# Patient Record
Sex: Female | Born: 1950 | Race: White | Hispanic: No | Marital: Married | Smoking: Never smoker
Health system: Southern US, Community
[De-identification: ages and names within clinical notes are randomized; demographics above are authoritative.]

## PROBLEM LIST (undated history)

## (undated) DIAGNOSIS — K219 Gastro-esophageal reflux disease without esophagitis: Secondary | ICD-10-CM

## (undated) DIAGNOSIS — F40240 Claustrophobia: Secondary | ICD-10-CM

## (undated) DIAGNOSIS — C50919 Malignant neoplasm of unspecified site of unspecified female breast: Secondary | ICD-10-CM

## (undated) DIAGNOSIS — F419 Anxiety disorder, unspecified: Secondary | ICD-10-CM

## (undated) DIAGNOSIS — I10 Essential (primary) hypertension: Secondary | ICD-10-CM

## (undated) HISTORY — DX: Essential (primary) hypertension: I10

## (undated) HISTORY — DX: Anxiety disorder, unspecified: F41.9

## (undated) HISTORY — DX: Malignant neoplasm of unspecified site of unspecified female breast: C50.919

## (undated) HISTORY — DX: Claustrophobia: F40.240

## (undated) HISTORY — DX: Gastro-esophageal reflux disease without esophagitis: K21.9

---

## 2013-11-28 DIAGNOSIS — Z0000 Encounter for general adult medical examination without abnormal findings: Secondary | ICD-10-CM | POA: Insufficient documentation

## 2013-11-30 DIAGNOSIS — J302 Other seasonal allergic rhinitis: Secondary | ICD-10-CM | POA: Insufficient documentation

## 2013-11-30 DIAGNOSIS — K219 Gastro-esophageal reflux disease without esophagitis: Secondary | ICD-10-CM | POA: Insufficient documentation

## 2014-04-02 DIAGNOSIS — R5383 Other fatigue: Secondary | ICD-10-CM | POA: Insufficient documentation

## 2016-10-16 DIAGNOSIS — I10 Essential (primary) hypertension: Secondary | ICD-10-CM | POA: Insufficient documentation

## 2017-01-12 DIAGNOSIS — C50411 Malignant neoplasm of upper-outer quadrant of right female breast: Secondary | ICD-10-CM | POA: Insufficient documentation

## 2017-01-12 DIAGNOSIS — Z170 Estrogen receptor positive status [ER+]: Secondary | ICD-10-CM

## 2017-01-19 DIAGNOSIS — C50919 Malignant neoplasm of unspecified site of unspecified female breast: Secondary | ICD-10-CM

## 2017-01-19 HISTORY — DX: Malignant neoplasm of unspecified site of unspecified female breast: C50.919

## 2017-01-25 DIAGNOSIS — C773 Secondary and unspecified malignant neoplasm of axilla and upper limb lymph nodes: Secondary | ICD-10-CM

## 2017-01-25 DIAGNOSIS — C50911 Malignant neoplasm of unspecified site of right female breast: Secondary | ICD-10-CM | POA: Insufficient documentation

## 2017-08-09 ENCOUNTER — Encounter: Payer: Self-pay | Admitting: Hematology

## 2017-10-08 ENCOUNTER — Encounter: Payer: Self-pay | Admitting: Hematology

## 2018-02-06 ENCOUNTER — Other Ambulatory Visit: Payer: Self-pay

## 2018-02-06 ENCOUNTER — Inpatient Hospital Stay (HOSPITAL_COMMUNITY): Payer: Medicare Other | Attending: Hematology | Admitting: Hematology

## 2018-02-06 ENCOUNTER — Encounter (HOSPITAL_COMMUNITY): Payer: Self-pay | Admitting: Hematology

## 2018-02-06 ENCOUNTER — Inpatient Hospital Stay (HOSPITAL_COMMUNITY): Payer: Medicare Other

## 2018-02-06 DIAGNOSIS — Z9221 Personal history of antineoplastic chemotherapy: Secondary | ICD-10-CM | POA: Diagnosis not present

## 2018-02-06 DIAGNOSIS — R921 Mammographic calcification found on diagnostic imaging of breast: Secondary | ICD-10-CM

## 2018-02-06 DIAGNOSIS — Z853 Personal history of malignant neoplasm of breast: Secondary | ICD-10-CM | POA: Diagnosis present

## 2018-02-06 DIAGNOSIS — Z923 Personal history of irradiation: Secondary | ICD-10-CM | POA: Diagnosis not present

## 2018-02-06 LAB — CBC WITH DIFFERENTIAL/PLATELET
BASOS ABS: 0 10*3/uL (ref 0.0–0.1)
BASOS PCT: 0 %
Eosinophils Absolute: 0.1 10*3/uL (ref 0.0–0.7)
Eosinophils Relative: 2 %
HCT: 38 % (ref 36.0–46.0)
HEMOGLOBIN: 13 g/dL (ref 12.0–15.0)
LYMPHS PCT: 33 %
Lymphs Abs: 2.3 10*3/uL (ref 0.7–4.0)
MCH: 32.2 pg (ref 26.0–34.0)
MCHC: 34.2 g/dL (ref 30.0–36.0)
MCV: 94.1 fL (ref 78.0–100.0)
Monocytes Absolute: 0.8 10*3/uL (ref 0.1–1.0)
Monocytes Relative: 11 %
NEUTROS PCT: 54 %
Neutro Abs: 3.9 10*3/uL (ref 1.7–7.7)
Platelets: 253 10*3/uL (ref 150–400)
RBC: 4.04 MIL/uL (ref 3.87–5.11)
RDW: 12.5 % (ref 11.5–15.5)
WBC: 7.1 10*3/uL (ref 4.0–10.5)

## 2018-02-06 LAB — COMPREHENSIVE METABOLIC PANEL
ALK PHOS: 125 U/L (ref 38–126)
ALT: 19 U/L (ref 14–54)
ANION GAP: 9 (ref 5–15)
AST: 24 U/L (ref 15–41)
Albumin: 4 g/dL (ref 3.5–5.0)
BILIRUBIN TOTAL: 0.8 mg/dL (ref 0.3–1.2)
BUN: 16 mg/dL (ref 6–20)
CALCIUM: 8.7 mg/dL — AB (ref 8.9–10.3)
CO2: 26 mmol/L (ref 22–32)
Chloride: 102 mmol/L (ref 101–111)
Creatinine, Ser: 0.6 mg/dL (ref 0.44–1.00)
GFR calc non Af Amer: >60 mL/min (ref >60)
Glucose, Bld: 74 mg/dL (ref 65–99)
POTASSIUM: 4.2 mmol/L (ref 3.5–5.1)
SODIUM: 137 mmol/L (ref 135–145)
TOTAL PROTEIN: 7.3 g/dL (ref 6.5–8.1)

## 2018-02-06 MED ORDER — SODIUM CHLORIDE 0.9% FLUSH
10.0000 mL | Freq: Once | INTRAVENOUS | Status: AC
  Administered 2018-02-06: 10 mL

## 2018-02-06 MED ORDER — HEPARIN SOD (PORK) LOCK FLUSH 100 UNIT/ML IV SOLN
INTRAVENOUS | Status: AC
  Filled 2018-02-06: qty 5

## 2018-02-06 MED ORDER — HEPARIN SOD (PORK) LOCK FLUSH 100 UNIT/ML IV SOLN
500.0000 [IU] | Freq: Once | INTRAVENOUS | Status: AC
  Administered 2018-02-06: 500 [IU] via INTRAVENOUS

## 2018-02-06 NOTE — Assessment & Plan Note (Addendum)
1.  Stage IIb (T2N1) right breast TNBC: - Status post lumpectomy and sentinel lymph node biopsy on 01/19/2017 by Dr. Johny Drilling at [facility], 3.6 cm tumor, one lymph node with metastatic carcinoma, 0.8 cm with extracapsular extension, one lymph node with rare isolated tumor cells, high-grade, positive lymphovascular invasion - Adjuvant chemotherapy with dose dense AC 4 cycles from 02/28/2017 through 04/18/2017, weekly paclitaxel from 04/24/2017 through 08/09/2017 - BRCA1/2 and other high risk genes negative -Adjuvant radiation therapy to the right breast from 10/01/2017 through 10/26/2017 - Recent mammogram on 02/04/2018 at [facility] shows grouped calcifications in the upper outer right breast, BI-RADS Category 3, with recommendation for six-month follow-up. -We will do blood work today.  Physical exam was within normal limits.  We will also check a vitamin D level.  She will follow-up with me once every 4 months for the first 3 years.  She wants to wait until a year before port will be discontinued.  We will plan to repeat a bone density test if it is not done in the last couple of years.

## 2018-02-06 NOTE — Progress Notes (Signed)
[facility] NOTE  Patient Care Team: Lonia Mad, MD as PCP - General (Internal Medicine)  CHIEF COMPLAINTS/PURPOSE OF CONSULTATION:  Follow-up for a triple negative right breast cancer.  HISTORY OF PRESENTING ILLNESS:  Patricia Estes 66 y.o. female is very pleasant white female who is seen in consultation today to establish follow-up visits for history of triple negative right breast cancer.  She had a lumpectomy done in June 2018 followed by adjuvant chemotherapy.  She finished adjuvant radiation therapy on 10/26/2017.  She denies any new onset pains.  She is exercising on regular basis.  She was reportedly started on high-dose vitamin D in April, which caused her to have numbness in the face.  She discontinued it in May.  She is not taking any calcium supplements.  She reports that she had a bone density test in the past.  She has occasional constipation which is stable.  Occasional dizziness from vertigo is also stable.  Denies any recent hospitalizations.  I reviewed her records extensively and collaborated the history with the patient.  Family history is positive for sister having melanoma, and cervical cancer, mother having melanoma and uterine cancer in her 14s.  MEDICAL HISTORY:  Past Medical History:  Diagnosis Date  . Anxiety   . Breast cancer ([place]) 01/19/2017   tRIPLE nEGATIVE   . Claustrophobia   . GERD (gastroesophageal reflux disease)   . Hypertension     SURGICAL HISTORY: History reviewed. No pertinent surgical history.  SOCIAL HISTORY: Social History   Socioeconomic History  . Marital status: Married    Spouse name: Not on file  . Number of children: Not on file  . Years of education: Not on file  . Highest education level: Not on file  Occupational History  . Not on file  Social Needs  . Financial resource strain: Not on file  . Food insecurity:    Worry: Not on file    Inability: Not on file  . Transportation needs:   Medical: Not on file    Non-medical: Not on file  Tobacco Use  . Smoking status: Not on file  Substance and Sexual Activity  . Alcohol use: Not on file  . Drug use: Not on file  . Sexual activity: Not on file  Lifestyle  . Physical activity:    Days per week: Not on file    Minutes per session: Not on file  . Stress: Not on file  Relationships  . Social connections:    Talks on phone: Not on file    Gets together: Not on file    Attends religious service: Not on file    Active member of club or organization: Not on file    Attends meetings of clubs or organizations: Not on file    Relationship status: Not on file  . Intimate partner violence:    Fear of current or ex partner: Not on file    Emotionally abused: Not on file    Physically abused: Not on file    Forced sexual activity: Not on file  Other Topics Concern  . Not on file  Social History Narrative  . Not on file    FAMILY HISTORY: History reviewed. No pertinent family history.  ALLERGIES:  is allergic to codeine; erythromycin; tetracycline; amoxicillin-pot clavulanate; eggs or egg-derived products; pseudoephedrine hcl; and sulfamethoxazole-trimethoprim.  MEDICATIONS:  Current Outpatient Medications  Medication Sig Dispense Refill  . omeprazole (PRILOSEC) 40 MG capsule Take by mouth.    Marland Kitchen  amLODipine (NORVASC) 5 MG tablet     . estradiol (ESTRACE VAGINAL) 0.1 MG/GM vaginal cream Estrace 0.01% (0.1 mg/gram) vaginal cream  APPLY 1 G BY FINGER APPLICATION INTRAVAGINALLY 30 DAYS    . furosemide (LASIX) 20 MG tablet     . KLOR-CON M20 20 MEQ tablet     . metoprolol tartrate (LOPRESSOR) 25 MG tablet     . tretinoin (RETIN-A) 0.05 % cream APPLY 1 APPLICATION BY TOPICAL ROUTE EVERY NIGHT  1   No current facility-administered medications for this visit.     REVIEW OF SYSTEMS:   Constitutional: Denies fevers, chills or abnormal night sweats Eyes: Denies blurriness of vision, double vision or watery eyes Ears, nose,  mouth, throat, and face: Denies mucositis or sore throat Respiratory: Denies cough, dyspnea or wheezes Cardiovascular: Denies palpitation, chest discomfort or lower extremity swelling Gastrointestinal:  Denies nausea, heartburn or change in bowel habits.  Occasional constipation. Skin: Denies abnormal skin rashes Lymphatics: Denies new lymphadenopathy or easy bruising Neurological:Denies numbness, tingling or new weaknesses Behavioral/Psych: Mood is stable, no new changes  Breast:  Denies any palpable lumps or discharge All other systems were reviewed with the patient and are negative.  PHYSICAL EXAMINATION: ECOG PERFORMANCE STATUS: 0 - Asymptomatic  Vitals:   02/06/18 1210  BP: 130/68  Pulse: 69  Resp: 16  SpO2: 100%   Filed Weights   02/06/18 1210  Weight: 151 lb 8 oz (68.7 kg)    GENERAL:alert, no distress and comfortable SKIN: skin color, texture, turgor are normal, no rashes or significant lesions EYES: normal, conjunctiva are pink and non-injected, sclera clear OROPHARYNX:no exudate, no erythema and lips, buccal mucosa, and tongue normal  NECK: supple, thyroid normal size, non-tender, without nodularity LYMPH:  no palpable lymphadenopathy in the cervical, axillary or inguinal LUNGS: clear to auscultation and percussion with normal breathing effort HEART: regular rate & rhythm and no murmurs and no lower extremity edema ABDOMEN:abdomen soft, non-tender and normal bowel sounds PSYCH: alert & oriented x 3 with fluent speech BREAST: Right breast lumpectomy site is within normal limits.  No palpable mass in bilateral breast.  No palpable axillary adenopathy.  LABORATORY DATA:  I have reviewed the data as listed Lab Results  Component Value Date   WBC 7.1 02/06/2018   HGB 13.0 02/06/2018   HCT 38.0 02/06/2018   MCV 94.1 02/06/2018   PLT 253 02/06/2018   Lab Results  Component Value Date   NA 137 02/06/2018   K 4.2 02/06/2018   CL 102 02/06/2018   CO2 26 02/06/2018     RADIOGRAPHIC STUDIES: I have personally reviewed the radiological reports and agreed with the findings in the report.  ASSESSMENT AND PLAN:  History of right breast cancer 1.  Stage IIb (T2N1) right breast TNBC: - Status post lumpectomy and sentinel lymph node biopsy on 01/19/2017 by Dr. Johny Drilling at [facility], 3.6 cm tumor, one lymph node with metastatic carcinoma, 0.8 cm with extracapsular extension, one lymph node with rare isolated tumor cells, high-grade, positive lymphovascular invasion - Adjuvant chemotherapy with dose dense AC 4 cycles from 02/28/2017 through 04/18/2017, weekly paclitaxel from 04/24/2017 through 08/09/2017 - BRCA1/2 and other high risk genes negative -Adjuvant radiation therapy to the right breast from 10/01/2017 through 10/26/2017 - Recent mammogram on 02/04/2018 at [facility] shows grouped calcifications in the upper outer right breast, BI-RADS Category 3, with recommendation for six-month follow-up. -We will do blood work today.  Physical exam was within normal limits.  We will also  check a vitamin D level.  She will follow-up with me once every 4 months for the first 3 years.  She wants to wait until a year before port will be discontinued.   All questions were answered. The patient knows to call the clinic with any problems, questions or concerns.    Derek Jack, MD 02/06/18

## 2018-02-07 LAB — CANCER ANTIGEN 15-3: CAN 15 3: 20.5 U/mL (ref 0.0–25.0)

## 2018-02-07 LAB — CANCER ANTIGEN 27.29: CAN 27.29: 24.7 U/mL (ref 0.0–38.6)

## 2018-02-07 LAB — VITAMIN D 25 HYDROXY (VIT D DEFICIENCY, FRACTURES): Vit D, 25-Hydroxy: 27.1 ng/mL — ABNORMAL LOW (ref 30.0–100.0)

## 2018-02-27 ENCOUNTER — Telehealth (HOSPITAL_COMMUNITY): Payer: Self-pay | Admitting: *Deleted

## 2018-02-27 NOTE — Telephone Encounter (Signed)
Pt aware of recent lab results. Pt advised to start taking low dose OTC vitamin D to increase her levels, per Dr. Delton Coombes. Pt verbalized understanding.

## 2018-04-26 ENCOUNTER — Inpatient Hospital Stay (HOSPITAL_COMMUNITY): Payer: Medicare Other | Attending: Hematology

## 2018-04-26 ENCOUNTER — Encounter (HOSPITAL_COMMUNITY): Payer: Self-pay

## 2018-04-26 VITALS — BP 128/66 | HR 52 | Temp 97.7°F | Resp 18

## 2018-04-26 DIAGNOSIS — Z853 Personal history of malignant neoplasm of breast: Secondary | ICD-10-CM

## 2018-04-26 LAB — COMPREHENSIVE METABOLIC PANEL WITH GFR
ALT: 23 U/L (ref 0–44)
AST: 22 U/L (ref 15–41)
Albumin: 3.9 g/dL (ref 3.5–5.0)
Alkaline Phosphatase: 107 U/L (ref 38–126)
Anion gap: 5 (ref 5–15)
BUN: 11 mg/dL (ref 8–23)
CO2: 29 mmol/L (ref 22–32)
Calcium: 8.7 mg/dL — ABNORMAL LOW (ref 8.9–10.3)
Chloride: 103 mmol/L (ref 98–111)
Creatinine, Ser: 0.63 mg/dL (ref 0.44–1.00)
GFR calc Af Amer: >60 mL/min
GFR calc non Af Amer: >60 mL/min
Glucose, Bld: 86 mg/dL (ref 70–99)
Potassium: 3.7 mmol/L (ref 3.5–5.1)
Sodium: 137 mmol/L (ref 135–145)
Total Bilirubin: 0.8 mg/dL (ref 0.3–1.2)
Total Protein: 6.8 g/dL (ref 6.5–8.1)

## 2018-04-26 LAB — CBC WITH DIFFERENTIAL/PLATELET
Basophils Absolute: 0 K/uL (ref 0.0–0.1)
Basophils Relative: 1 %
Eosinophils Absolute: 0.1 K/uL (ref 0.0–0.7)
Eosinophils Relative: 2 %
HCT: 37.7 % (ref 36.0–46.0)
Hemoglobin: 13.1 g/dL (ref 12.0–15.0)
Lymphocytes Relative: 34 %
Lymphs Abs: 1.9 K/uL (ref 0.7–4.0)
MCH: 32.5 pg (ref 26.0–34.0)
MCHC: 34.7 g/dL (ref 30.0–36.0)
MCV: 93.5 fL (ref 78.0–100.0)
Monocytes Absolute: 0.8 K/uL (ref 0.1–1.0)
Monocytes Relative: 14 %
Neutro Abs: 2.8 K/uL (ref 1.7–7.7)
Neutrophils Relative %: 49 %
Platelets: 209 K/uL (ref 150–400)
RBC: 4.03 MIL/uL (ref 3.87–5.11)
RDW: 12.5 % (ref 11.5–15.5)
WBC: 5.6 K/uL (ref 4.0–10.5)

## 2018-04-26 MED ORDER — HEPARIN SOD (PORK) LOCK FLUSH 100 UNIT/ML IV SOLN
500.0000 [IU] | Freq: Once | INTRAVENOUS | Status: AC
  Administered 2018-04-26: 500 [IU] via INTRAVENOUS

## 2018-04-26 MED ORDER — SODIUM CHLORIDE 0.9% FLUSH: 10.0000 mL | INTRAVENOUS | Status: DC | PRN

## 2018-04-26 MED ORDER — SODIUM CHLORIDE 0.9% FLUSH
10.0000 mL | INTRAVENOUS | Status: DC | PRN
  Administered 2018-04-26 (×2): 10 mL via INTRAVENOUS
  Filled 2018-04-26 (×2): qty 10

## 2018-04-26 MED ORDER — HEPARIN SOD (PORK) LOCK FLUSH 100 UNIT/ML IV SOLN
INTRAVENOUS | Status: AC
  Filled 2018-04-26: qty 5

## 2018-04-26 NOTE — Progress Notes (Signed)
Latha Celmer tolerated port flush with lab draw well without complaints or incident. VSS Port accessed with 20 gauge needle with blood drawn for labs ordered then flushed with 20 ml NS and 5 ml Heparin easily per protocol then de-accessed. Pt complained of increased firmness noted under right breast. Dr. Delton Coombes made aware and explained to pt that scar tissue can develop after radiation treatment and pt to see MD in October. Pt verbalized understanding. Pt discharged self ambulatory in satisfactory condition

## 2018-04-26 NOTE — Patient Instructions (Addendum)
[facility] Discharge Instructions  Labs drawn from portacath then flushed per protocol. Follow-up as scheduled. Call clinic for any questions or concerns   Thank you for choosing [facility] at [facility] to provide your oncology and hematology care.  To afford each patient quality time with our provider, please arrive at least 15 minutes before your scheduled appointment time.   If you have a lab appointment with the Cancer Center please come in thru the  Main Entrance and check in at the main information desk  You need to re-schedule your appointment should you arrive 10 or more minutes late.  We strive to give you quality time with our providers, and arriving late affects you and other patients whose appointments are after yours.  Also, if you no show three or more times for appointments you may be dismissed from the clinic at the providers discretion.     Again, thank you for choosing [facility].  Our hope is that these requests will decrease the amount of time that you wait before being seen by our physicians.       _____________________________________________________________  Should you have questions after your visit to  Cancer Center, please contact our office at [phone] between the hours of 8:00 a.m. and 4:30 p.m.  Voicemails left after 4:00 p.m. will not be returned until the following business day.  For prescription refill requests, have your pharmacy contact our office and allow 72 hours.    Cancer Center Support Programs:   > Cancer Support Group  2nd Tuesday of the month 1pm-2pm, Journey Room   

## 2018-04-27 LAB — CANCER ANTIGEN 27.29: CAN 27.29: 23.8 U/mL (ref 0.0–38.6)

## 2018-04-27 LAB — CANCER ANTIGEN 15-3: CA 15-3: 20.5 U/mL (ref 0.0–25.0)

## 2018-04-27 LAB — VITAMIN D 25 HYDROXY (VIT D DEFICIENCY, FRACTURES): Vit D, 25-Hydroxy: 33.3 ng/mL (ref 30.0–100.0)

## 2018-04-29 ENCOUNTER — Telehealth (HOSPITAL_COMMUNITY): Payer: Self-pay

## 2018-04-29 NOTE — Telephone Encounter (Signed)
Pt informed of lab results per her request on 04/26/18 with understanding verbalized

## 2018-05-09 ENCOUNTER — Other Ambulatory Visit (HOSPITAL_COMMUNITY): Payer: BLUE CROSS/BLUE SHIELD

## 2018-06-10 ENCOUNTER — Ambulatory Visit (HOSPITAL_COMMUNITY): Payer: BLUE CROSS/BLUE SHIELD | Admitting: Hematology

## 2018-06-10 ENCOUNTER — Encounter (HOSPITAL_COMMUNITY): Payer: Self-pay | Admitting: Internal Medicine

## 2018-06-10 ENCOUNTER — Other Ambulatory Visit: Payer: Self-pay

## 2018-06-10 ENCOUNTER — Ambulatory Visit (HOSPITAL_COMMUNITY): Payer: Medicare Other | Admitting: Internal Medicine

## 2018-06-10 ENCOUNTER — Inpatient Hospital Stay (HOSPITAL_COMMUNITY): Payer: Medicare Other | Attending: Internal Medicine | Admitting: Internal Medicine

## 2018-06-10 VITALS — BP 138/72 | HR 61 | Temp 97.7°F | Resp 18 | Wt 152.0 lb

## 2018-06-10 DIAGNOSIS — Z9221 Personal history of antineoplastic chemotherapy: Secondary | ICD-10-CM

## 2018-06-10 DIAGNOSIS — I10 Essential (primary) hypertension: Secondary | ICD-10-CM

## 2018-06-10 DIAGNOSIS — C773 Secondary and unspecified malignant neoplasm of axilla and upper limb lymph nodes: Secondary | ICD-10-CM

## 2018-06-10 DIAGNOSIS — Z171 Estrogen receptor negative status [ER-]: Secondary | ICD-10-CM | POA: Diagnosis not present

## 2018-06-10 DIAGNOSIS — C50911 Malignant neoplasm of unspecified site of right female breast: Secondary | ICD-10-CM

## 2018-06-10 DIAGNOSIS — Z923 Personal history of irradiation: Secondary | ICD-10-CM | POA: Diagnosis not present

## 2018-06-10 DIAGNOSIS — C50411 Malignant neoplasm of upper-outer quadrant of right female breast: Secondary | ICD-10-CM

## 2018-06-10 DIAGNOSIS — Z79899 Other long term (current) drug therapy: Secondary | ICD-10-CM | POA: Diagnosis not present

## 2018-06-10 NOTE — Progress Notes (Signed)
Diagnosis Breast cancer metastasized to axillary lymph node, right ([place]) - Plan: CBC with Differential/Platelet, Comprehensive metabolic panel, Lactate dehydrogenase, Cancer antigen 15-3, Cancer antigen 27.29, DG Bone Density  Staging Cancer Staging No matching staging information was found for the patient.  Assessment and Plan:  1.  Stage IIb (T2N1) right breast TNBC: - Status post lumpectomy and sentinel lymph node biopsy on 01/19/2017 by Dr. Johny Drilling at [facility], 3.6 cm tumor, one lymph node with metastatic carcinoma, 0.8 cm with extracapsular extension, one lymph node with rare isolated tumor cells, high-grade, positive lymphovascular invasion - Adjuvant chemotherapy with dose dense AC 4 cycles from 02/28/2017 through 04/18/2017, weekly paclitaxel from 04/24/2017 through 08/09/2017 - BRCA1/2 and other high risk genes negative -Adjuvant radiation therapy to the right breast from 10/01/2017 through 10/26/2017 - Recent mammogram on 02/04/2018 at [facility] shows grouped calcifications in the upper outer right breast, BI-RADS Category 3, with recommendation for six-month follow-up.  Pt has mammogram set up for 07/2018 at [facility] and follow-up.  Dr. Worthy Keeler will review [facility] information from imaging.  Pt will be seen for follow-up in 09/2018.  Labs done 04/26/2018 reviewed with pt and shows WBC 5.6 HB 13 plts 209,000.  Chemistries WNL with K+ 3.7, Cr 0.63 and normal LFTs.  Ca 15.3 20.5 and CA 27.29 23.8 which are both WNL.    Pt will RTC in 09/2018 with labs and follow-up with Dr. Worthy Keeler.    2  Breast concerns.  Postsurgical changes noted on exam.  Pt is scheduled for mammogram in 07/2018 at [facility] and will follow-up with [facility] at that time to go over results.    3.  Health maintenance.  Pt is set up for bone density evaluation.  Follow-up with GI as recommended.   25 minutes spent with more than 50% spent in counseling and coordination of care.    Interval History: Historical data  obtained from note dated 02/06/2018:  67 yr old female followed by Dr. Worthy Keeler due to triple negative right breast cancer.  She had a lumpectomy done in June 2018 followed by adjuvant chemotherapy.  She finished adjuvant radiation therapy on 10/26/2017.  She is exercising on regular basis.   Current Status:  Pt is seen today for follow-up.  She reports she is scheduled for mammogram in 07/2018 at [facility].    Problem List Patient Active Problem List   Diagnosis Date Noted  . History of right breast cancer [Z85.3] 02/06/2018  . Breast cancer metastasized to axillary lymph node, right ([place]) [C50.911, C77.3] 01/25/2017  . Malignant neoplasm of upper-outer quadrant of right breast in female, estrogen receptor positive ([place]) [C50.411, Z17.0] 01/12/2017  . Hypertensive disorder [I10] 10/16/2016  . Fatigue [R53.83] 04/02/2014  . GERD (gastroesophageal reflux disease) [K21.9] 11/30/2013  . Seasonal allergies [J30.2] 11/30/2013  . Preventative health care [Z00.00] 11/28/2013    Past Medical History Past Medical History:  Diagnosis Date  . Anxiety   . Breast cancer ([place]) 01/19/2017   tRIPLE nEGATIVE   . Claustrophobia   . GERD (gastroesophageal reflux disease)   . Hypertension     Past Surgical History History reviewed. No pertinent surgical history.  Family History History reviewed. No pertinent family history.   Social History  reports that she has never smoked. She has never used smokeless tobacco.  Medications  Current Outpatient Medications:  .  Acetaminophen 500 MG coapsule, Take by mouth., Disp: , Rfl:  .  albuterol (VENTOLIN HFA) 108 (90 Base) MCG/ACT inhaler, , Disp: , Rfl:  .  ALPRAZolam (XANAX) 0.25 MG tablet, TAKE 1 TABLET BY MOUTH TWICE A DAY AS NEEDED FOR ANXIETY, Disp: , Rfl: 0 .  amLODipine (NORVASC) 5 MG tablet, , Disp: , Rfl:  .  EPINEPHrine 0.3 mg/0.3 mL IJ SOAJ injection, , Disp: , Rfl:  .  estradiol (ESTRACE VAGINAL) 0.1 MG/GM vaginal cream, Estrace 0.01% (0.1  mg/gram) vaginal cream  APPLY 1 G BY FINGER APPLICATION INTRAVAGINALLY 30 DAYS, Disp: , Rfl:  .  furosemide (LASIX) 20 MG tablet, , Disp: , Rfl:  .  KLOR-CON M20 20 MEQ tablet, , Disp: , Rfl:  .  METOPROLOL SUCCINATE PO, Take by mouth., Disp: , Rfl:  .  metoprolol tartrate (LOPRESSOR) 25 MG tablet, , Disp: , Rfl:  .  omeprazole (PRILOSEC) 40 MG capsule, Take by mouth., Disp: , Rfl:  .  tretinoin (RETIN-A) 0.05 % cream, APPLY 1 APPLICATION BY TOPICAL ROUTE EVERY NIGHT, Disp: , Rfl: 1  Allergies Codeine; Erythromycin; Tetracycline; Amoxicillin-pot clavulanate; Eggs or egg-derived products; Pseudoephedrine hcl; and Sulfamethoxazole-trimethoprim  Review of Systems Review of Systems - Oncology ROS negative   Physical Exam  Vitals Wt Readings from Last 3 Encounters:  06/10/18 152 lb (68.9 kg)  02/06/18 151 lb 8 oz (68.7 kg)   Temp Readings from Last 3 Encounters:  06/10/18 97.7 F (36.5 C) (Oral)  04/26/18 97.7 F (36.5 C) (Oral)   BP Readings from Last 3 Encounters:  06/10/18 138/72  04/26/18 128/66  02/06/18 130/68   Pulse Readings from Last 3 Encounters:  06/10/18 61  04/26/18 (!) 52  02/06/18 69   Constitutional: Well-developed, well-nourished, and in no distress.   HENT: Head: Normocephalic and atraumatic.  Mouth/Throat: No oropharyngeal exudate. Mucosa moist. Eyes: Pupils are equal, round, and reactive to light. Conjunctivae are normal. No scleral icterus.  Neck: Normal range of motion. Neck supple. No JVD present.  Cardiovascular: Normal rate, regular rhythm and normal heart sounds.  Exam reveals no gallop and no friction rub.   No murmur heard. Pulmonary/Chest: Effort normal and breath sounds normal. No respiratory distress. No wheezes.No rales.  Abdominal: Soft. Bowel sounds are normal. No distension. There is no tenderness. There is no guarding.  Musculoskeletal: No edema or tenderness.  Lymphadenopathy: No cervical, axillary or supraclavicular adenopathy.   Neurological: Alert and oriented to person, place, and time. No cranial nerve deficit.  Skin: Skin is warm and dry. No rash noted. No erythema. No pallor.  Psychiatric: Affect and judgment normal.  Breast exam.  Chaperone present.  Lumpectomy changes noted in right breast.  No dominant masses palpable bilaterally.   Labs No visits with results within 3 Day(s) from this visit.  Latest known visit with results is:  Infusion on 04/26/2018  Component Date Value Ref Range Status  . Vit D, 25-Hydroxy 04/26/2018 33.3  30.0 - 100.0 ng/mL Final   Comment: (NOTE) Vitamin D deficiency has been defined by the [place] practice guideline as a level of serum 25-OH vitamin D less than 20 ng/mL (1,2). The Endocrine Society went on to further define vitamin D insufficiency as a level between 21 and 29 ng/mL (2). 1. IOM (Institute of Medicine). 2010. Dietary reference   intakes for calcium and D. [place]: The   [employer]. 2. Holick MF, Binkley Snead, Bischoff-Ferrari HA, et al.   Evaluation, treatment, and prevention of vitamin D   deficiency: an Endocrine Society clinical practice   guideline. JCEM. 2011 Jul; 96(7):1911-30. Performed At: [facility] [address]  [address] [place], [place] [id number] Rush Farmer MD [id number]   . CA 15-3 04/26/2018 20.5  0.0 - 25.0 U/mL Final   Comment: (NOTE) Roche Diagnostics Electrochemiluminescence Immunoassay (ECLIA) Values obtained with different assay methods or kits cannot be used interchangeably.  Results cannot be interpreted as absolute evidence of the presence or absence of malignant disease. Performed At: [facility] [place], [place] [id number] Rush Farmer MD [id number]   . CA 27.29 04/26/2018 23.8  0.0 - 38.6 U/mL Final   Comment: (NOTE) Siemens Centaur Immunochemiluminometric Methodology [facility]) Values obtained with different assay methods or kits  cannot be used interchangeably. Results cannot be interpreted as absolute evidence of the presence or absence of malignant disease. Performed At: [facility] [place], [place] [id number] Rush Farmer MD [id number]   . Sodium 04/26/2018 137  135 - 145 mmol/L Final  . Potassium 04/26/2018 3.7  3.5 - 5.1 mmol/L Final  . Chloride 04/26/2018 103  98 - 111 mmol/L Final  . CO2 04/26/2018 29  22 - 32 mmol/L Final  . Glucose, Bld 04/26/2018 86  70 - 99 mg/dL Final  . BUN 04/26/2018 11  8 - 23 mg/dL Final  . Creatinine, Ser 04/26/2018 0.63  0.44 - 1.00 mg/dL Final  . Calcium 04/26/2018 8.7* 8.9 - 10.3 mg/dL Final  . Total Protein 04/26/2018 6.8  6.5 - 8.1 g/dL Final  . Albumin 04/26/2018 3.9  3.5 - 5.0 g/dL Final  . AST 04/26/2018 22  15 - 41 U/L Final  . ALT 04/26/2018 23  0 - 44 U/L Final  . Alkaline Phosphatase 04/26/2018 107  38 - 126 U/L Final  . Total Bilirubin 04/26/2018 0.8  0.3 - 1.2 mg/dL Final  . GFR calc non Af Amer 04/26/2018 >60  >60 mL/min Final  . GFR calc Af Amer 04/26/2018 >60  >60 mL/min Final   Comment: (NOTE) The eGFR has been calculated using the CKD EPI equation. This calculation has not been validated in all clinical situations. eGFR's persistently <60 mL/min signify possible Chronic Kidney Disease.   Georgiann Hahn gap 04/26/2018 5  5 - 15 Final   Performed at [facility], [address], [place], [place] [address]  . WBC 04/26/2018 5.6  4.0 - 10.5 K/uL Final  . RBC 04/26/2018 4.03  3.87 - 5.11 MIL/uL Final  . Hemoglobin 04/26/2018 13.1  12.0 - 15.0 g/dL Final  . HCT 04/26/2018 37.7  36.0 - 46.0 % Final  . MCV 04/26/2018 93.5  78.0 - 100.0 fL Final  . MCH 04/26/2018 32.5  26.0 - 34.0 pg Final  . MCHC 04/26/2018 34.7  30.0 - 36.0 g/dL Final  . RDW 04/26/2018 12.5  11.5 - 15.5 % Final  . Platelets 04/26/2018 209  150 - 400 K/uL Final  . Neutrophils Relative % 04/26/2018 49  % Final  . Neutro Abs 04/26/2018 2.8  1.7 - 7.7 K/uL Final  .  Lymphocytes Relative 04/26/2018 34  % Final  . Lymphs Abs 04/26/2018 1.9  0.7 - 4.0 K/uL Final  . Monocytes Relative 04/26/2018 14  % Final  . Monocytes Absolute 04/26/2018 0.8  0.1 - 1.0 K/uL Final  . Eosinophils Relative 04/26/2018 2  % Final  . Eosinophils Absolute 04/26/2018 0.1  0.0 - 0.7 K/uL Final  . Basophils Relative 04/26/2018 1  % Final  . Basophils Absolute 04/26/2018 0.0  0.0 - 0.1 K/uL Final   Performed at [facility], [address] [place], [place] [address]  Pathology Orders Placed This Encounter  Procedures  . DG Bone Density    Standing Status:   Future    Standing Expiration Date:   06/10/2019    Order Specific Question:   Reason for Exam (SYMPTOM  OR DIAGNOSIS REQUIRED)    Answer:   postmenopausal    Order Specific Question:   Preferred imaging location?    Answer:   [facility]  . CBC with Differential/Platelet    Standing Status:   Future    Standing Expiration Date:   06/10/2020  . Comprehensive metabolic panel    Standing Status:   Future    Standing Expiration Date:   06/10/2020  . Lactate dehydrogenase    Standing Status:   Future    Standing Expiration Date:   06/10/2020  . Cancer antigen 15-3    Standing Status:   Future    Standing Expiration Date:   06/10/2020  . Cancer antigen 27.29    Standing Status:   Future    Standing Expiration Date:   06/10/2020       Zoila Shutter MD

## 2018-06-20 ENCOUNTER — Other Ambulatory Visit (HOSPITAL_COMMUNITY): Payer: BLUE CROSS/BLUE SHIELD

## 2018-06-27 ENCOUNTER — Ambulatory Visit (HOSPITAL_COMMUNITY)
Admission: RE | Admit: 2018-06-27 | Discharge: 2018-06-27 | Disposition: A | Discharge status: Home / Self Care | Payer: Medicare Other | Source: Ambulatory Visit | Attending: Internal Medicine | Admitting: Internal Medicine

## 2018-06-27 DIAGNOSIS — C50911 Malignant neoplasm of unspecified site of right female breast: Secondary | ICD-10-CM

## 2018-06-27 DIAGNOSIS — Z780 Asymptomatic menopausal state: Secondary | ICD-10-CM | POA: Insufficient documentation

## 2018-06-27 DIAGNOSIS — C773 Secondary and unspecified malignant neoplasm of axilla and upper limb lymph nodes: Secondary | ICD-10-CM | POA: Diagnosis not present

## 2018-06-27 DIAGNOSIS — M8589 Other specified disorders of bone density and structure, multiple sites: Secondary | ICD-10-CM | POA: Insufficient documentation

## 2018-07-01 ENCOUNTER — Encounter (HOSPITAL_COMMUNITY): Payer: Self-pay

## 2018-07-02 ENCOUNTER — Telehealth (HOSPITAL_COMMUNITY): Payer: Self-pay | Admitting: *Deleted

## 2018-07-02 NOTE — Telephone Encounter (Signed)
Pt aware of results of bone density, also aware that she needs to start taking vit D and Calcium. Pt states that she can not tolerate the supplements, they do a number on her stomach, so she will try to find foods to help. Pt verbalized understanding.

## 2018-07-26 ENCOUNTER — Inpatient Hospital Stay (HOSPITAL_COMMUNITY): Payer: Medicare Other | Attending: Hematology

## 2018-07-26 ENCOUNTER — Encounter (HOSPITAL_COMMUNITY): Payer: Self-pay

## 2018-07-26 DIAGNOSIS — Z79899 Other long term (current) drug therapy: Secondary | ICD-10-CM | POA: Insufficient documentation

## 2018-07-26 DIAGNOSIS — C50411 Malignant neoplasm of upper-outer quadrant of right female breast: Secondary | ICD-10-CM | POA: Insufficient documentation

## 2018-07-26 DIAGNOSIS — Z923 Personal history of irradiation: Secondary | ICD-10-CM | POA: Insufficient documentation

## 2018-07-26 DIAGNOSIS — C773 Secondary and unspecified malignant neoplasm of axilla and upper limb lymph nodes: Secondary | ICD-10-CM

## 2018-07-26 DIAGNOSIS — C50911 Malignant neoplasm of unspecified site of right female breast: Secondary | ICD-10-CM

## 2018-07-26 DIAGNOSIS — C779 Secondary and unspecified malignant neoplasm of lymph node, unspecified: Secondary | ICD-10-CM | POA: Insufficient documentation

## 2018-07-26 DIAGNOSIS — I10 Essential (primary) hypertension: Secondary | ICD-10-CM | POA: Diagnosis not present

## 2018-07-26 DIAGNOSIS — Z9221 Personal history of antineoplastic chemotherapy: Secondary | ICD-10-CM | POA: Insufficient documentation

## 2018-07-26 DIAGNOSIS — Z170 Estrogen receptor positive status [ER+]: Secondary | ICD-10-CM | POA: Diagnosis not present

## 2018-07-26 LAB — CBC WITH DIFFERENTIAL/PLATELET
Abs Immature Granulocytes: 0.08 10*3/uL — ABNORMAL HIGH (ref 0.00–0.07)
BASOS PCT: 0 %
Basophils Absolute: 0 10*3/uL (ref 0.0–0.1)
EOS ABS: 0 10*3/uL (ref 0.0–0.5)
Eosinophils Relative: 0 %
HEMATOCRIT: 36.2 % (ref 36.0–46.0)
Hemoglobin: 12.2 g/dL (ref 12.0–15.0)
Immature Granulocytes: 1 %
Lymphocytes Relative: 13 %
Lymphs Abs: 1.8 10*3/uL (ref 0.7–4.0)
MCH: 31.9 pg (ref 26.0–34.0)
MCHC: 33.7 g/dL (ref 30.0–36.0)
MCV: 94.8 fL (ref 80.0–100.0)
Monocytes Absolute: 1.6 10*3/uL — ABNORMAL HIGH (ref 0.1–1.0)
Monocytes Relative: 11 %
Neutro Abs: 10.8 10*3/uL — ABNORMAL HIGH (ref 1.7–7.7)
Neutrophils Relative %: 75 %
Platelets: 302 10*3/uL (ref 150–400)
RBC: 3.82 MIL/uL — ABNORMAL LOW (ref 3.87–5.11)
RDW: 11.8 % (ref 11.5–15.5)
WBC: 14.2 10*3/uL — ABNORMAL HIGH (ref 4.0–10.5)
nRBC: 0 % (ref 0.0–0.2)

## 2018-07-26 LAB — COMPREHENSIVE METABOLIC PANEL
ALT: 22 U/L (ref 0–44)
AST: 29 U/L (ref 15–41)
Albumin: 3.7 g/dL (ref 3.5–5.0)
Alkaline Phosphatase: 105 U/L (ref 38–126)
Anion gap: 9 (ref 5–15)
BUN: 13 mg/dL (ref 8–23)
CALCIUM: 8.8 mg/dL — AB (ref 8.9–10.3)
CO2: 24 mmol/L (ref 22–32)
CREATININE: 0.58 mg/dL (ref 0.44–1.00)
Chloride: 103 mmol/L (ref 98–111)
GFR calc Af Amer: >60 mL/min (ref >60)
Glucose, Bld: 96 mg/dL (ref 70–99)
Potassium: 4 mmol/L (ref 3.5–5.1)
Sodium: 136 mmol/L (ref 135–145)
TOTAL PROTEIN: 7 g/dL (ref 6.5–8.1)
Total Bilirubin: 0.4 mg/dL (ref 0.3–1.2)

## 2018-07-26 LAB — LACTATE DEHYDROGENASE: LDH: 333 U/L — ABNORMAL HIGH (ref 98–192)

## 2018-07-26 MED ORDER — HEPARIN SOD (PORK) LOCK FLUSH 100 UNIT/ML IV SOLN
500.0000 [IU] | Freq: Once | INTRAVENOUS | Status: AC
  Administered 2018-07-26: 500 [IU] via INTRAVENOUS

## 2018-07-26 MED ORDER — SODIUM CHLORIDE 0.9% FLUSH
20.0000 mL | INTRAVENOUS | Status: DC | PRN
  Administered 2018-07-26: 20 mL via INTRAVENOUS
  Filled 2018-07-26: qty 20

## 2018-07-26 NOTE — Patient Instructions (Signed)
[place] at [facility] Discharge Instructions  Labs drawn from portacath then flushed per protocol today. Follow-up as scheduled. Call clinic for any questions or concerns   Thank you for choosing [place] at [facility] to provide your oncology and hematology care.  To afford each patient quality time with our provider, please arrive at least 15 minutes before your scheduled appointment time.   If you have a lab appointment with the [facility] please come in thru the  Main Entrance and check in at the main information desk  You need to re-schedule your appointment should you arrive 10 or more minutes late.  We strive to give you quality time with our providers, and arriving late affects you and other patients whose appointments are after yours.  Also, if you no show three or more times for appointments you may be dismissed from the clinic at the providers discretion.     Again, thank you for choosing [facility].  Our hope is that these requests will decrease the amount of time that you wait before being seen by our physicians.       _____________________________________________________________  Should you have questions after your visit to [facility] please contact our office at [phone] between the hours of 8:00 a.m. and 4:30 p.m.  Voicemails left after 4:00 p.m. will not be returned until the following business day.  For prescription refill requests, have your pharmacy contact our office and allow 72 hours.    Cancer Center Support Programs:   > Cancer Support Group  2nd Tuesday of the month 1pm-2pm, Journey Room

## 2018-07-26 NOTE — Progress Notes (Signed)
Patricia Estes tolerated port lab draw with flush well without complaints or incident. Port accessed with 20 gauge needle with blood obtained for labs ordered then flushed with 20 ml NS and 5 ml Heparin easily per protocol then de-accessed. VSS Pt discharged self ambulatory in satisfactory condition

## 2018-07-27 LAB — CANCER ANTIGEN 27.29: CAN 27.29: 74.1 U/mL — AB (ref 0.0–38.6)

## 2018-07-27 LAB — CANCER ANTIGEN 15-3: CA 15-3: 54.3 U/mL — ABNORMAL HIGH (ref 0.0–25.0)

## 2018-08-01 ENCOUNTER — Encounter (HOSPITAL_COMMUNITY): Payer: Self-pay

## 2018-08-01 ENCOUNTER — Encounter (HOSPITAL_COMMUNITY): Payer: Self-pay | Admitting: *Deleted

## 2018-08-02 ENCOUNTER — Encounter (HOSPITAL_COMMUNITY): Payer: Self-pay | Admitting: Hematology

## 2018-08-02 ENCOUNTER — Other Ambulatory Visit: Payer: Self-pay

## 2018-08-02 ENCOUNTER — Inpatient Hospital Stay (HOSPITAL_BASED_OUTPATIENT_CLINIC_OR_DEPARTMENT_OTHER): Payer: Medicare Other | Admitting: Hematology

## 2018-08-02 VITALS — BP 126/64 | HR 81 | Temp 97.7°F | Resp 16 | Wt 148.0 lb

## 2018-08-02 DIAGNOSIS — I10 Essential (primary) hypertension: Secondary | ICD-10-CM | POA: Diagnosis not present

## 2018-08-02 DIAGNOSIS — Z923 Personal history of irradiation: Secondary | ICD-10-CM

## 2018-08-02 DIAGNOSIS — Z170 Estrogen receptor positive status [ER+]: Secondary | ICD-10-CM

## 2018-08-02 DIAGNOSIS — C50911 Malignant neoplasm of unspecified site of right female breast: Secondary | ICD-10-CM

## 2018-08-02 DIAGNOSIS — Z79899 Other long term (current) drug therapy: Secondary | ICD-10-CM

## 2018-08-02 DIAGNOSIS — C773 Secondary and unspecified malignant neoplasm of axilla and upper limb lymph nodes: Secondary | ICD-10-CM

## 2018-08-02 DIAGNOSIS — Z9221 Personal history of antineoplastic chemotherapy: Secondary | ICD-10-CM

## 2018-08-02 DIAGNOSIS — C50411 Malignant neoplasm of upper-outer quadrant of right female breast: Secondary | ICD-10-CM

## 2018-08-02 NOTE — Patient Instructions (Signed)
[place] at [facility] Discharge Instructions  Have labs checked in 2 weeks. Call us a few day after for results. Keep appointment in January.    Thank you for choosing [place] at [facility] to provide your oncology and hematology care.  To afford each patient quality time with our provider, please arrive at least 15 minutes before your scheduled appointment time.   If you have a lab appointment with the [facility] please come in thru the  Main Entrance and check in at the main information desk  You need to re-schedule your appointment should you arrive 10 or more minutes late.  We strive to give you quality time with our providers, and arriving late affects you and other patients whose appointments are after yours.  Also, if you no show three or more times for appointments you may be dismissed from the clinic at the providers discretion.     Again, thank you for choosing [facility].  Our hope is that these requests will decrease the amount of time that you wait before being seen by our physicians.       _____________________________________________________________  Should you have questions after your visit to [facility], please contact our office at [phone] between the hours of 8:00 a.m. and 4:30 p.m.  Voicemails left after 4:00 p.m. will not be returned until the following business day.  For prescription refill requests, have your pharmacy contact our office and allow 72 hours.    Cancer Center Support Programs:   > Cancer Support Group  2nd Tuesday of the month 1pm-2pm, Journey Room

## 2018-08-02 NOTE — Progress Notes (Signed)
[place], [place] [address]   CLINIC:  Medical Oncology/Hematology  PCP:  Lonia Mad, MD No address on file None   REASON FOR VISIT: Follow-up for Stage IIb (T2N1) right breast TNBC  CURRENT THERAPY: observation   INTERVAL HISTORY:  Patricia Estes 67 y.o. female returns for routine follow-up for right breast cancer triple negative. She is here today and has recently had a diagnoses of pneumonia. She has lost her voice and barely able to speak. She has a slightly palpable lymph node left axillary. She has an appointment on Monday with [facility] and her next mammogram. Denies any nausea, vomiting, or diarrhea. Denies any fevers. Denies any new pains. Denies any bleeding or easy bruising. She reports her appetite at 75% and her energy level is 50%. She is not having any weight loss.        REVIEW OF SYSTEMS:  Review of Systems  Respiratory: Positive for cough and shortness of breath.   Neurological: Positive for dizziness and numbness.  All other systems reviewed and are negative.    PAST MEDICAL/SURGICAL HISTORY:  Past Medical History:  Diagnosis Date  . Anxiety   . Breast cancer ([place]) 01/19/2017   tRIPLE nEGATIVE   . Claustrophobia   . GERD (gastroesophageal reflux disease)   . Hypertension    History reviewed. No pertinent surgical history.   SOCIAL HISTORY:  Social History   Socioeconomic History  . Marital status: Married    Spouse name: Not on file  . Number of children: Not on file  . Years of education: Not on file  . Highest education level: Not on file  Occupational History  . Not on file  Social Needs  . Financial resource strain: Not on file  . Food insecurity:    Worry: Not on file    Inability: Not on file  . Transportation needs:    Medical: Not on file    Non-medical: Not on file  Tobacco Use  . Smoking status: Never Smoker  . Smokeless tobacco: Never Used  Substance and Sexual Activity  . Alcohol use:  Not on file  . Drug use: Not on file  . Sexual activity: Not on file  Lifestyle  . Physical activity:    Days per week: Not on file    Minutes per session: Not on file  . Stress: Not on file  Relationships  . Social connections:    Talks on phone: Not on file    Gets together: Not on file    Attends religious service: Not on file    Active member of club or organization: Not on file    Attends meetings of clubs or organizations: Not on file    Relationship status: Not on file  . Intimate partner violence:    Fear of current or ex partner: Not on file    Emotionally abused: Not on file    Physically abused: Not on file    Forced sexual activity: Not on file  Other Topics Concern  . Not on file  Social History Narrative  . Not on file    FAMILY HISTORY:  History reviewed. No pertinent family history.  CURRENT MEDICATIONS:  Outpatient Encounter Medications as of 08/02/2018  Medication Sig  . Acetaminophen 500 MG coapsule Take by mouth.  Marland Kitchen albuterol (VENTOLIN HFA) 108 (90 Base) MCG/ACT inhaler   . ALPRAZolam (XANAX) 0.25 MG tablet TAKE 1 TABLET BY MOUTH TWICE A DAY AS NEEDED FOR ANXIETY  .  amLODipine (NORVASC) 5 MG tablet   . cefdinir (OMNICEF) 300 MG capsule   . EPINEPHrine 0.3 mg/0.3 mL IJ SOAJ injection   . estradiol (ESTRACE VAGINAL) 0.1 MG/GM vaginal cream Estrace 0.01% (0.1 mg/gram) vaginal cream  APPLY 1 G BY FINGER APPLICATION INTRAVAGINALLY 30 DAYS  . furosemide (LASIX) 20 MG tablet   . KLOR-CON M20 20 MEQ tablet   . METOPROLOL SUCCINATE PO Take by mouth.  . metoprolol tartrate (LOPRESSOR) 25 MG tablet   . omeprazole (PRILOSEC) 40 MG capsule Take by mouth.  . pantoprazole (PROTONIX) 20 MG tablet   . tretinoin (RETIN-A) 0.05 % cream APPLY 1 APPLICATION BY TOPICAL ROUTE EVERY NIGHT   No facility-administered encounter medications on file as of 08/02/2018.     ALLERGIES:  Allergies  Allergen Reactions  . Codeine Anaphylaxis, Rash and Swelling  . Erythromycin  Hives  . Tetracycline Hives and Rash    erythromycin erythromycin erythromycin erythromycin   . Amoxicillin-Pot Clavulanate Rash  . Eggs Or Egg-Derived Products Diarrhea  . Pseudoephedrine Hcl Palpitations  . Sulfamethoxazole-Trimethoprim Rash     PHYSICAL EXAM:  ECOG Performance status: 1  Vitals:   08/02/18 0924  BP: 126/64  Pulse: 81  Resp: 16  Temp: 97.7 F (36.5 C)  SpO2: 99%   Filed Weights   08/02/18 0924  Weight: 148 lb (67.1 kg)    Physical Exam Constitutional:      Appearance: Normal appearance. She is normal weight.  Abdominal:     General: Abdomen is flat.     Palpations: Abdomen is soft.  Musculoskeletal: Normal range of motion.  Skin:    General: Skin is warm and dry.  Neurological:     Mental Status: She is alert and oriented to person, place, and time. Mental status is at baseline.  Psychiatric:        Mood and Affect: Mood normal.        Behavior: Behavior normal.        Thought Content: Thought content normal.        Judgment: Judgment normal.      LABORATORY DATA:  I have reviewed the labs as listed.  CBC    Component Value Date/Time   WBC 14.2 (H) 07/26/2018 0830   RBC 3.82 (L) 07/26/2018 0830   HGB 12.2 07/26/2018 0830   HCT 36.2 07/26/2018 0830   PLT 302 07/26/2018 0830   MCV 94.8 07/26/2018 0830   MCH 31.9 07/26/2018 0830   MCHC 33.7 07/26/2018 0830   RDW 11.8 07/26/2018 0830   LYMPHSABS 1.8 07/26/2018 0830   MONOABS 1.6 (H) 07/26/2018 0830   EOSABS 0.0 07/26/2018 0830   BASOSABS 0.0 07/26/2018 0830   CMP Latest Ref Rng & Units 07/26/2018 04/26/2018 02/06/2018  Glucose 70 - 99 mg/dL 96 86 74  BUN 8 - 23 mg/dL _0 Creatinine 0.44 - 1.00 mg/dL 0.58 0.63 0.60  Sodium 135 - 145 mmol/L 136 137 137  Potassium 3.5 - 5.1 mmol/L 4.0 3.7 4.2  Chloride 98 - 111 mmol/L 103 103 102  CO2 22 - 32 mmol/L _1 Calcium 8.9 - 10.3 mg/dL 8.8(L) 8.7(L) 8.7(L)  Total Protein 6.5 - 8.1 g/dL 7.0 6.8 7.3  Total Bilirubin 0.3 - 1.2  mg/dL 0.4 0.8 0.8  Alkaline Phos 38 - 126 U/L 105 107 125  AST 15 - 41 U/L _2 ALT 0 - 44 U/L _3 DIAGNOSTIC IMAGING:  I  have independently reviewed the scans and discussed with the patient.   I have reviewed Francene Finders, NP's note and agree with the documentation.  I personally performed a face-to-face visit, made revisions and my assessment and plan is as follows.    ASSESSMENT & PLAN:   Malignant neoplasm of upper-outer quadrant of right breast in female, estrogen receptor positive ([place]) 1.  Stage IIb (T2N1) right breast TNBC: - Status post lumpectomy and sentinel lymph node biopsy on 01/19/2017 by Dr. Johny Drilling at [facility], 3.6 cm tumor, one lymph node with metastatic carcinoma, 0.8 cm with extracapsular extension, one lymph node with rare isolated tumor cells, high-grade, positive lymphovascular invasion - Adjuvant chemotherapy with dose dense AC 4 cycles from 02/28/2017 through 04/18/2017, weekly paclitaxel from 04/24/2017 through 08/09/2017 - BRCA1/2 and other high risk genes negative -Adjuvant radiation therapy to the right breast from 10/01/2017 through 10/26/2017 - Last mammogram on 02/04/2018 at [facility] showed calcifications in the upper outer right breast, BI-RADS Category 3, with recommendation for six-month follow-up.  - I have discussed the recent blood work.  Her tumor markers went up.  She reports that she has not been feeling well since last week with respiratory symptoms like cough.  She was evaluated at an urgent care last Wednesday.  She was given Ceftin and did not help.  She went to the ER on Saturday.  She was also seen by Dr.Isernia this week and is taking doxycycline. - Physical exam today showed left axillary lymph node freely mobile.  No clear left breast mass seen.  Right breast lumpectomy site was normal.  No palpable masses in the right breast. -She has an appointment to see her surgeon at [facility] after mammogram on Monday. -I  plan to repeat her tumor markers in 2 to 3 weeks.  If they do not come down to normal, I will consider imaging with a PET scan.  I would like to wait for imaging until after she clears her pneumonia.      Orders placed this encounter:  Orders Placed This Encounter  Procedures  . Cancer antigen 15-3  . Cancer antigen 27.29  . Lactate dehydrogenase  . CBC with Differential/Platelet  . Comprehensive metabolic panel      Derek Jack, MD [place] [phone]

## 2018-08-02 NOTE — Assessment & Plan Note (Signed)
1.  Stage IIb (T2N1) right breast TNBC: - Status post lumpectomy and sentinel lymph node biopsy on 01/19/2017 by Dr. Johny Drilling at [facility], 3.6 cm tumor, one lymph node with metastatic carcinoma, 0.8 cm with extracapsular extension, one lymph node with rare isolated tumor cells, high-grade, positive lymphovascular invasion - Adjuvant chemotherapy with dose dense AC 4 cycles from 02/28/2017 through 04/18/2017, weekly paclitaxel from 04/24/2017 through 08/09/2017 - BRCA1/2 and other high risk genes negative -Adjuvant radiation therapy to the right breast from 10/01/2017 through 10/26/2017 - Last mammogram on 02/04/2018 at [facility] showed calcifications in the upper outer right breast, BI-RADS Category 3, with recommendation for six-month follow-up.  - I have discussed the recent blood work.  Her tumor markers went up.  She reports that she has not been feeling well since last week with respiratory symptoms like cough.  She was evaluated at an urgent care last Wednesday.  She was given Ceftin and did not help.  She went to the ER on Saturday.  She was also seen by Dr.Isernia this week and is taking doxycycline. - Physical exam today showed left axillary lymph node freely mobile.  No clear left breast mass seen.  Right breast lumpectomy site was normal.  No palpable masses in the right breast. -She has an appointment to see her surgeon at [facility] after mammogram on Monday. -I plan to repeat her tumor markers in 2 to 3 weeks.  If they do not come down to normal, I will consider imaging with a PET scan.  I would like to wait for imaging until after she clears her pneumonia.

## 2018-08-16 ENCOUNTER — Other Ambulatory Visit (HOSPITAL_COMMUNITY): Payer: BLUE CROSS/BLUE SHIELD

## 2018-08-23 ENCOUNTER — Ambulatory Visit (HOSPITAL_COMMUNITY): Payer: BLUE CROSS/BLUE SHIELD | Admitting: Hematology

## 2018-08-23 ENCOUNTER — Encounter (HOSPITAL_COMMUNITY): Payer: Self-pay

## 2018-10-01 ENCOUNTER — Other Ambulatory Visit (HOSPITAL_COMMUNITY): Payer: BLUE CROSS/BLUE SHIELD

## 2018-10-08 ENCOUNTER — Ambulatory Visit (HOSPITAL_COMMUNITY): Payer: BLUE CROSS/BLUE SHIELD | Admitting: Hematology

## 2018-11-20 DEATH — deceased
